# Patient Record
Sex: Female | Born: 1980 | Race: Black or African American | Hispanic: No | Marital: Married | Smoking: Never smoker
Health system: Southern US, Community
[De-identification: ages and names within clinical notes are randomized; demographics above are authoritative.]

## PROBLEM LIST (undated history)

## (undated) DIAGNOSIS — O24419 Gestational diabetes mellitus in pregnancy, unspecified control: Secondary | ICD-10-CM

## (undated) DIAGNOSIS — N390 Urinary tract infection, site not specified: Secondary | ICD-10-CM

## (undated) DIAGNOSIS — O09299 Supervision of pregnancy with other poor reproductive or obstetric history, unspecified trimester: Secondary | ICD-10-CM

## (undated) HISTORY — PX: WISDOM TOOTH EXTRACTION: SHX21

## (undated) HISTORY — PX: NO PAST SURGERIES: SHX2092

---

## 2009-02-11 ENCOUNTER — Ambulatory Visit (HOSPITAL_COMMUNITY): Admission: RE | Admit: 2009-02-11 | Discharge: 2009-02-11 | Payer: Self-pay | Admitting: Obstetrics and Gynecology

## 2009-03-24 ENCOUNTER — Inpatient Hospital Stay (HOSPITAL_COMMUNITY): Admission: AD | Admit: 2009-03-24 | Discharge: 2009-03-24 | Payer: Self-pay | Admitting: Obstetrics and Gynecology

## 2009-04-21 ENCOUNTER — Inpatient Hospital Stay (HOSPITAL_COMMUNITY): Admission: AD | Admit: 2009-04-21 | Discharge: 2009-04-23 | Payer: Self-pay | Admitting: Obstetrics and Gynecology

## 2009-04-22 ENCOUNTER — Encounter (INDEPENDENT_AMBULATORY_CARE_PROVIDER_SITE_OTHER): Payer: Self-pay | Admitting: Obstetrics and Gynecology

## 2009-04-26 DEATH — deceased

## 2009-12-08 ENCOUNTER — Ambulatory Visit (HOSPITAL_COMMUNITY): Admission: RE | Admit: 2009-12-08 | Discharge: 2009-12-08 | Payer: Self-pay | Admitting: Obstetrics and Gynecology

## 2010-03-12 ENCOUNTER — Inpatient Hospital Stay (HOSPITAL_COMMUNITY)
Admission: AD | Admit: 2010-03-12 | Discharge: 2010-03-13 | Payer: Self-pay | Source: Home / Self Care | Attending: Obstetrics and Gynecology | Admitting: Obstetrics and Gynecology

## 2010-03-15 LAB — RH IMMUNE GLOBULIN WORKUP (NOT WOMEN'S HOSP)
ABO/RH(D): A NEG
Antibody Screen: NEGATIVE
Unit division: 0

## 2010-05-11 LAB — RH IMMUNE GLOBULIN WORKUP (NOT WOMEN'S HOSP)
ABO/RH(D): A NEG
Antibody Screen: NEGATIVE
Unit division: 0

## 2010-05-15 LAB — COMPREHENSIVE METABOLIC PANEL
ALT: 18 U/L (ref 0–35)
AST: 19 U/L (ref 0–37)
Albumin: 2.7 g/dL — ABNORMAL LOW (ref 3.5–5.2)
Alkaline Phosphatase: 63 U/L (ref 39–117)
BUN: 5 mg/dL — ABNORMAL LOW (ref 6–23)
CO2: 20 mEq/L (ref 19–32)
Calcium: 8.7 mg/dL (ref 8.4–10.5)
Chloride: 106 mEq/L (ref 96–112)
Creatinine, Ser: 0.47 mg/dL (ref 0.4–1.2)
GFR calc Af Amer: >60 mL/min (ref >60)
GFR calc non Af Amer: >60 mL/min (ref >60)
Glucose, Bld: 84 mg/dL (ref 70–99)
Potassium: 3.5 mEq/L (ref 3.5–5.1)
Sodium: 133 mEq/L — ABNORMAL LOW (ref 135–145)
Total Bilirubin: 0.5 mg/dL (ref 0.3–1.2)
Total Protein: 6.3 g/dL (ref 6.0–8.3)

## 2010-05-15 LAB — CBC
HCT: 36.2 % (ref 36.0–46.0)
Hemoglobin: 12.2 g/dL (ref 12.0–15.0)
MCHC: 33.8 g/dL (ref 30.0–36.0)
MCV: 94 fL (ref 78.0–100.0)
Platelets: 310 10*3/uL (ref 150–400)
RBC: 3.85 MIL/uL — ABNORMAL LOW (ref 3.87–5.11)
RDW: 13.8 % (ref 11.5–15.5)
WBC: 9 10*3/uL (ref 4.0–10.5)

## 2010-05-15 LAB — LACTATE DEHYDROGENASE: LDH: 109 U/L (ref 94–250)

## 2010-05-15 LAB — URIC ACID: Uric Acid, Serum: 4.1 mg/dL (ref 2.4–7.0)

## 2010-05-18 LAB — ANA: Anti Nuclear Antibody(ANA): POSITIVE — AB

## 2010-05-18 LAB — RPR: RPR Ser Ql: NONREACTIVE

## 2010-05-18 LAB — CBC
HCT: 40.3 % (ref 36.0–46.0)
HCT: 41.8 % (ref 36.0–46.0)
Hemoglobin: 13.4 g/dL (ref 12.0–15.0)
Hemoglobin: 13.7 g/dL (ref 12.0–15.0)
MCHC: 32.8 g/dL (ref 30.0–36.0)
MCHC: 33.1 g/dL (ref 30.0–36.0)
MCV: 95.3 fL (ref 78.0–100.0)
MCV: 95.9 fL (ref 78.0–100.0)
Platelets: 293 10*3/uL (ref 150–400)
Platelets: 336 10*3/uL (ref 150–400)
RBC: 4.23 MIL/uL (ref 3.87–5.11)
RBC: 4.36 MIL/uL (ref 3.87–5.11)
RDW: 13.9 % (ref 11.5–15.5)
RDW: 14 % (ref 11.5–15.5)
WBC: 13.1 10*3/uL — ABNORMAL HIGH (ref 4.0–10.5)
WBC: 18.5 10*3/uL — ABNORMAL HIGH (ref 4.0–10.5)

## 2010-05-18 LAB — LUPUS ANTICOAGULANT PANEL
Lupus Anticoagulant: DETECTED — AB
PTT Lupus Anticoagulant: 46.3 secs — ABNORMAL HIGH (ref 32.0–43.4)

## 2010-05-18 LAB — PROTEIN S, TOTAL: Protein S Ag, Total: 64 % — ABNORMAL LOW (ref 70–140)

## 2010-05-18 LAB — CARDIOLIPIN ANTIBODIES, IGG, IGM, IGA
Anticardiolipin IgA: <3 APL U/mL — ABNORMAL LOW (ref <10)
Anticardiolipin IgG: 5 GPL U/mL — ABNORMAL LOW (ref <10)
Anticardiolipin IgM: <3 MPL U/mL — ABNORMAL LOW (ref <10)

## 2010-05-18 LAB — DIC (DISSEMINATED INTRAVASCULAR COAGULATION)PANEL
D-Dimer, Quant: 2.02 ug/mL-FEU — ABNORMAL HIGH (ref 0.00–0.48)
Fibrinogen: 573 mg/dL — ABNORMAL HIGH (ref 204–475)
INR: 0.97 (ref 0.00–1.49)
Platelets: 336 10*3/uL (ref 150–400)
Prothrombin Time: 12.8 seconds (ref 11.6–15.2)
Smear Review: NONE SEEN
aPTT: 27 seconds (ref 24–37)

## 2010-05-18 LAB — COMPREHENSIVE METABOLIC PANEL
ALT: 13 U/L (ref 0–35)
AST: 18 U/L (ref 0–37)
Albumin: 2.6 g/dL — ABNORMAL LOW (ref 3.5–5.2)
Alkaline Phosphatase: 88 U/L (ref 39–117)
BUN: 4 mg/dL — ABNORMAL LOW (ref 6–23)
CO2: 20 mEq/L (ref 19–32)
Calcium: 8.7 mg/dL (ref 8.4–10.5)
Chloride: 109 mEq/L (ref 96–112)
Creatinine, Ser: 0.46 mg/dL (ref 0.4–1.2)
GFR calc Af Amer: >60 mL/min (ref >60)
GFR calc non Af Amer: >60 mL/min (ref >60)
Glucose, Bld: 94 mg/dL (ref 70–99)
Potassium: 4.1 mEq/L (ref 3.5–5.1)
Sodium: 135 mEq/L (ref 135–145)
Total Bilirubin: 0.4 mg/dL (ref 0.3–1.2)
Total Protein: 6 g/dL (ref 6.0–8.3)

## 2010-05-18 LAB — ANTITHROMBIN III: AntiThromb III Func: 68 % — ABNORMAL LOW (ref 75–120)

## 2010-05-18 LAB — PROTIME-INR: Prothrombin Time: 13 seconds (ref 11.6–15.2)

## 2010-05-18 LAB — RAPID URINE DRUG SCREEN, HOSP PERFORMED
Amphetamines: NOT DETECTED
Barbiturates: NOT DETECTED
Benzodiazepines: NOT DETECTED
Cocaine: NOT DETECTED
Opiates: NOT DETECTED

## 2010-05-18 LAB — WOUND CULTURE
Culture: NO GROWTH
Gram Stain: NONE SEEN
Gram Stain: NONE SEEN

## 2010-05-18 LAB — RH IMMUNE GLOB WKUP(>/=20WKS)(NOT WOMEN'S HOSP): Fetal Screen: NEGATIVE

## 2010-05-18 LAB — BETA-2-GLYCOPROTEIN I ABS, IGG/M/A
Beta-2 Glyco I IgG: <3 U/mL (ref <=15)
Beta-2-Glycoprotein I IgA: <3 U/mL (ref <=15)
Beta-2-Glycoprotein I IgM: <3 U/mL (ref <=15)

## 2010-05-18 LAB — D-DIMER, QUANTITATIVE: D-Dimer, Quant: 0.88 ug/mL-FEU — ABNORMAL HIGH (ref 0.00–0.48)

## 2010-05-18 LAB — FACTOR 5 LEIDEN

## 2010-05-18 LAB — PROTEIN C, TOTAL: Protein C, Total: 94 % (ref 70–140)

## 2010-05-18 LAB — FIBRINOGEN: Fibrinogen: 586 mg/dL — ABNORMAL HIGH (ref 204–475)

## 2010-05-18 LAB — ANTI-NUCLEAR AB-TITER (ANA TITER): ANA Titer 1: NEGATIVE

## 2010-05-29 LAB — RH IMMUNE GLOBULIN WORKUP (NOT WOMEN'S HOSP)

## 2011-01-24 IMAGING — US US OB LIMITED
1 series · 7 of 7 positions shown · non-contrast
Comparison: none

OBSTETRICAL ULTRASOUND:
 This ultrasound exam was performed in the [HOSPITAL] Ultrasound Department.  The OB US report was generated in the AS system, and faxed to the ordering physician.  This report is also available in [HOSPITAL]?s Access[employer] and in [REDACTED] PACS.

[Series 1: us ob limited · 0.24mm/px · 7 acquisitions, 7 frames shown]
[im 1/7]
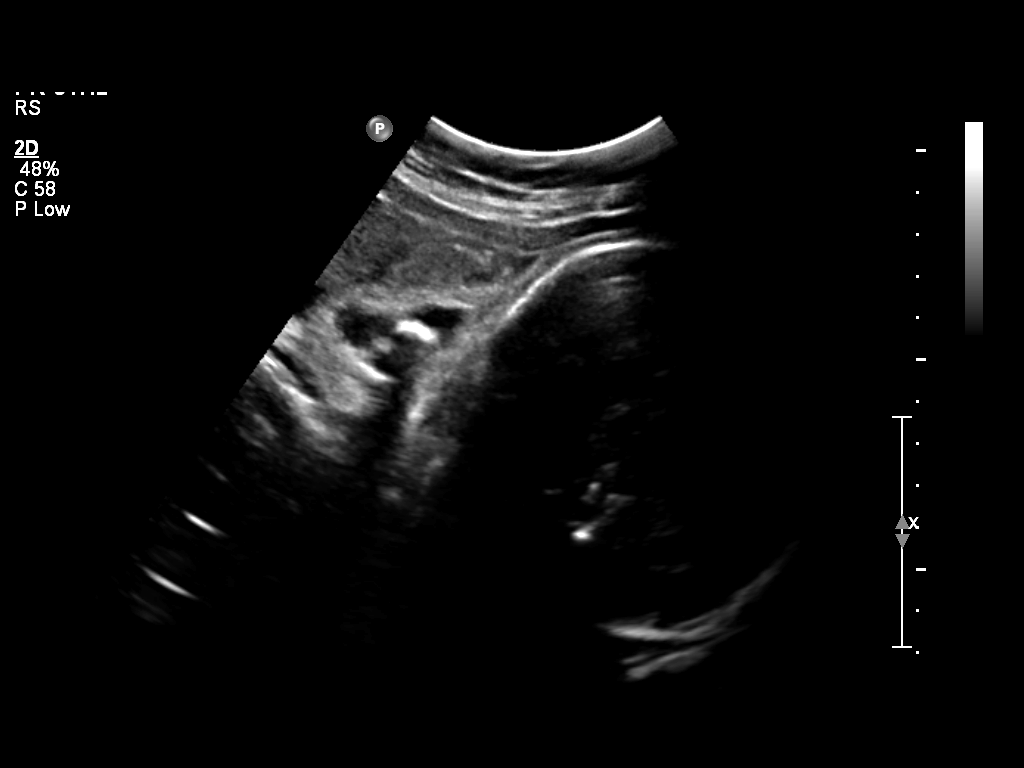
[im 2/7]
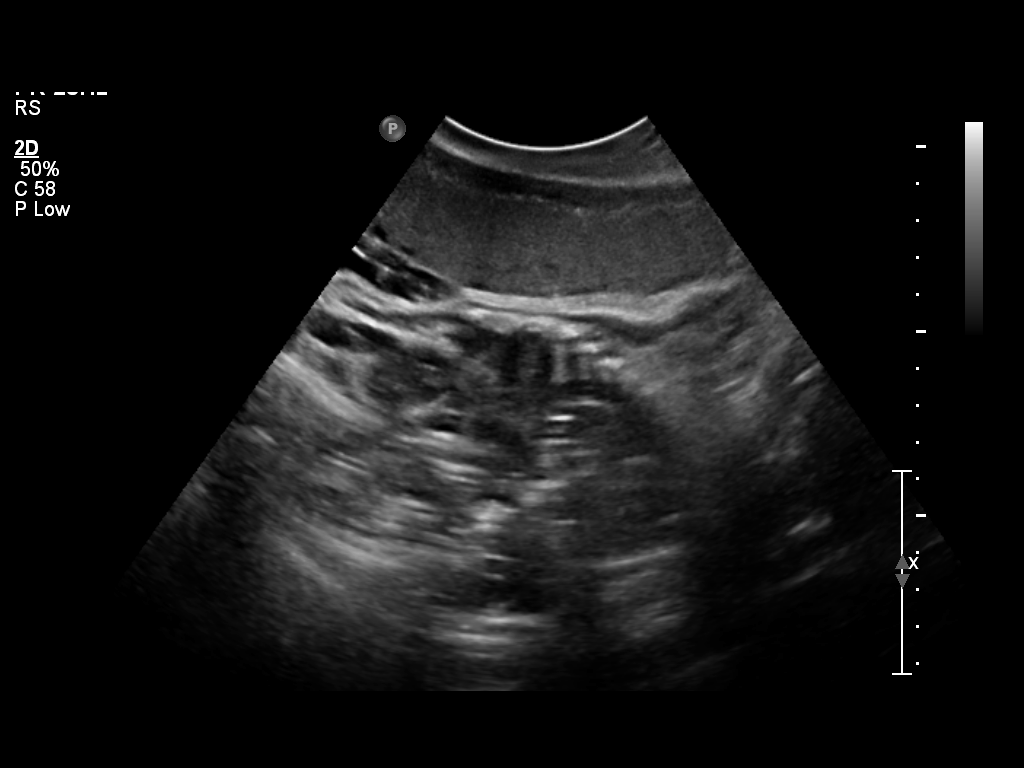
[im 3/7]
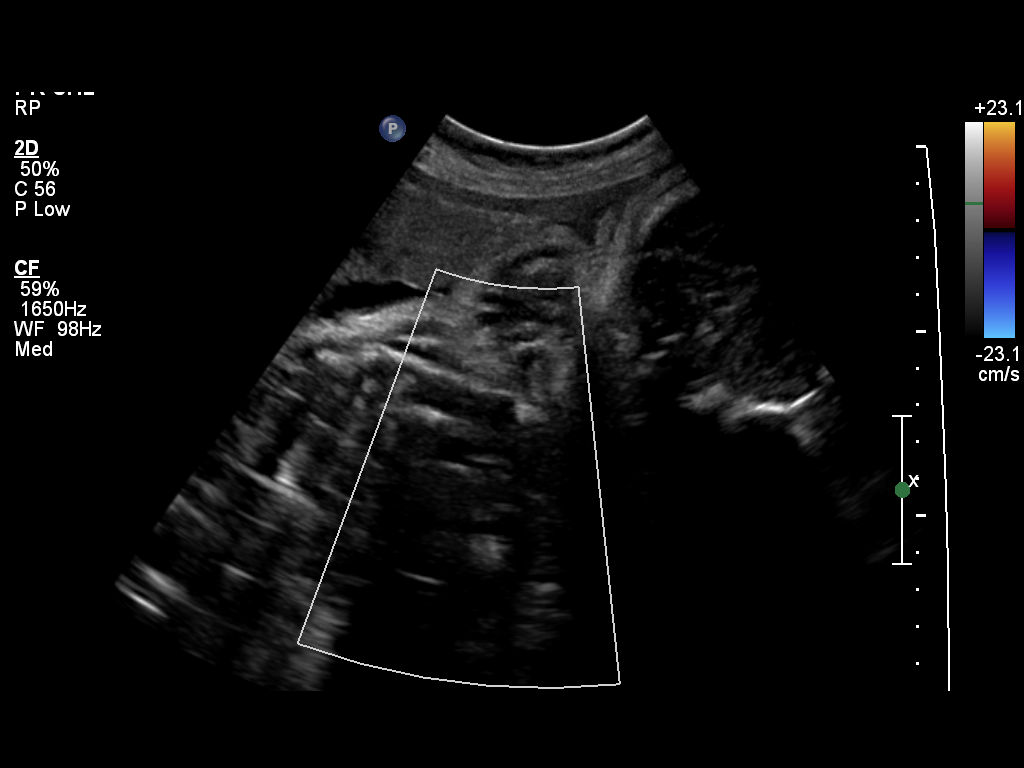
[im 4/7]
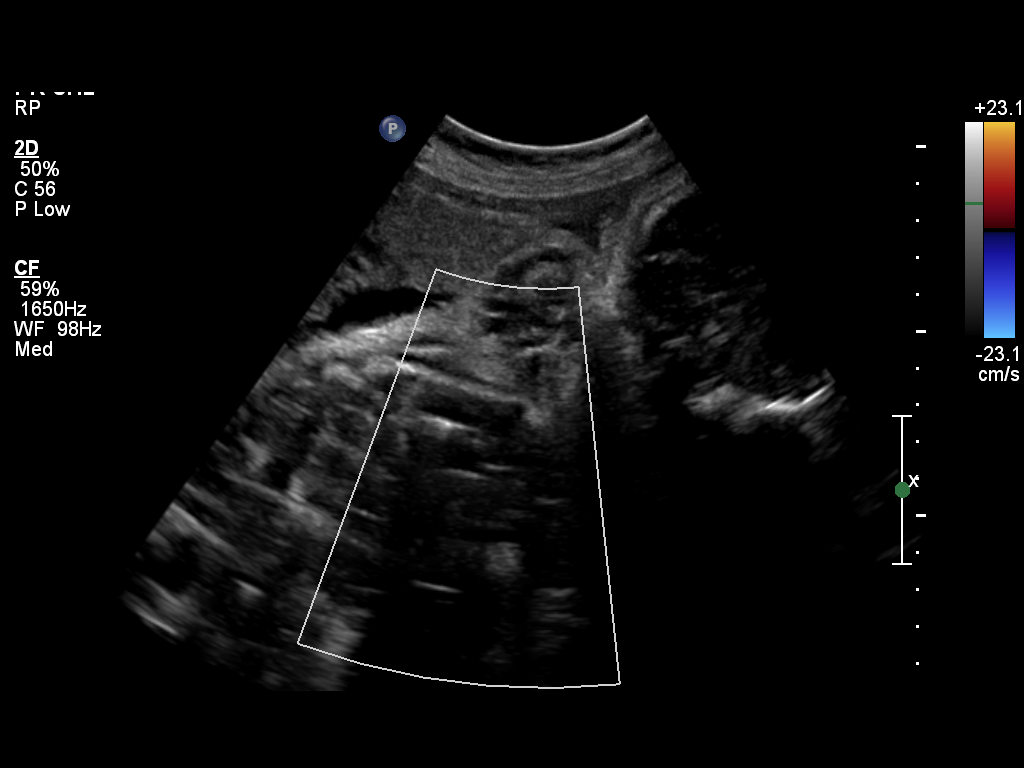
[im 5/7]
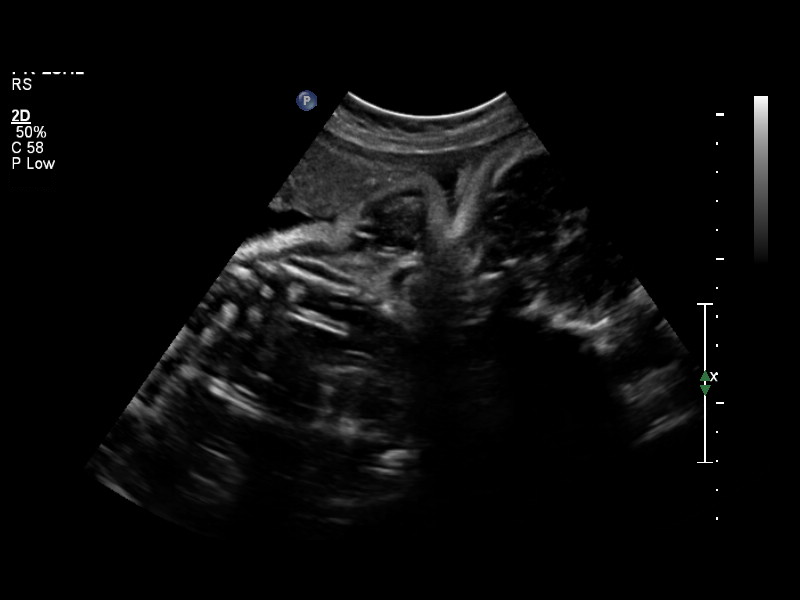
[im 6/7]
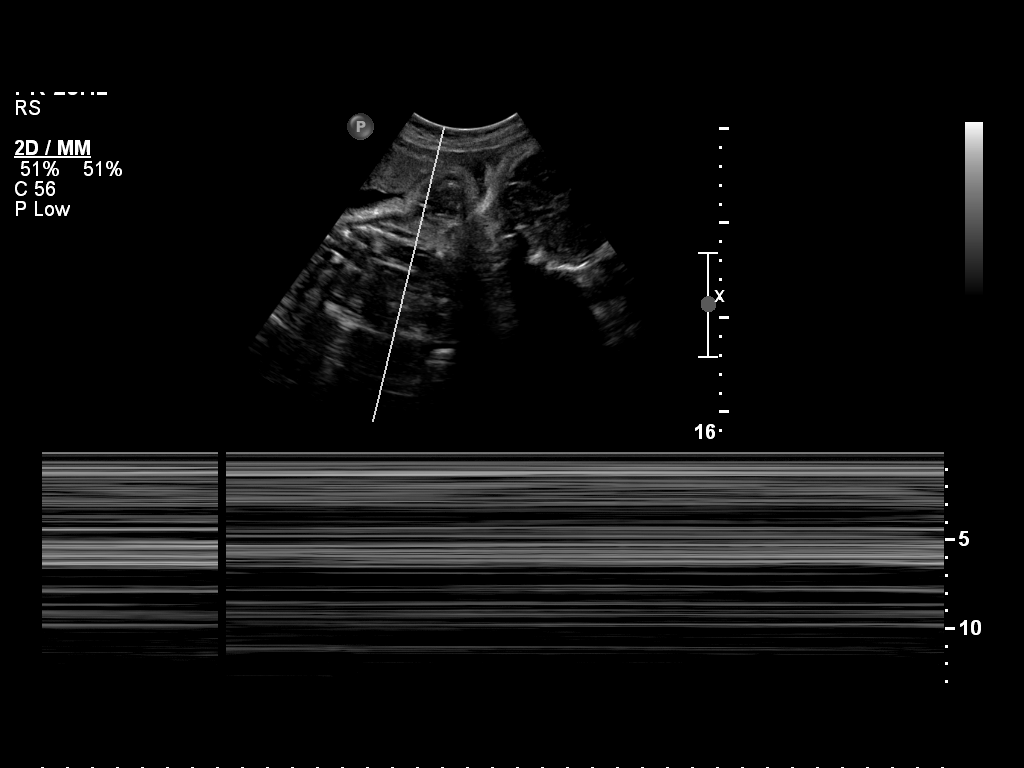
[im 7/7]
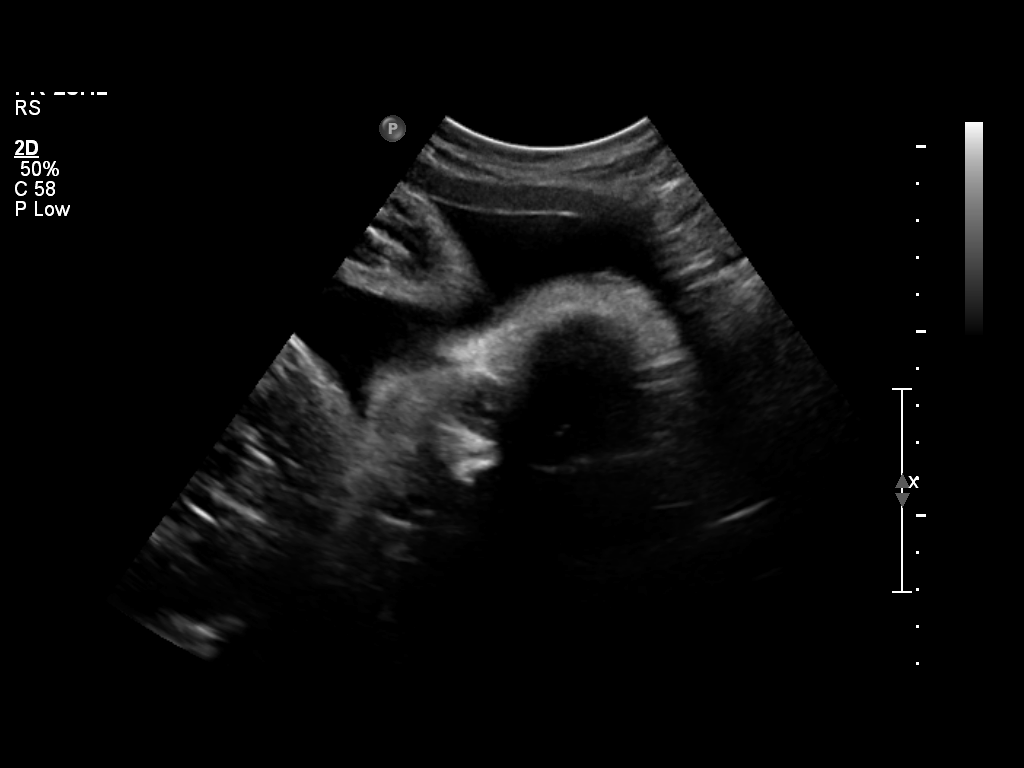

[7 of 7 positions shown; findings below may reference images not displayed]

IMPRESSION: See AS Obstetric US report.

## 2011-02-27 NOTE — L&D Delivery Note (Signed)
Delivery Note  Complete dilation at 0036 Onset of pushing at 0025 / cervix reduced to complete in order to facilitate delivery due to repetitive variable decels FHR second stage variable decels to nadir 80s x 30-50 sec  Analgesia - NONE / Anesthesia - NONE  Delivery of a viable female at 35 by CNM in LOA position.  Nuchal Cord x 1 / additional wrap x 1 body and x1 leg / fetal hand holding cord Cord double clamped after cessation of pulsation, cut by FOB.  Cord blood sample collected.  Very thin cord noted -placenta to pathology  Placenta delivered 0046 intact with 3 VC.  Uterine tone firm / bleeding small  No perineal laceration identified.  Small periurethral LAC - 1% xylocaine local to site for repair single 4-0 placed for hemostasis Est. Blood Loss (mL): 300 ml  Complications: none  Mom to postpartum.  Baby to Mom for breastfeeding.  Brandy Carroll 05/09/2011, 5:04 AM ( late entry - computer down)

## 2011-05-08 ENCOUNTER — Encounter (HOSPITAL_COMMUNITY): Payer: Self-pay | Admitting: *Deleted

## 2011-05-08 ENCOUNTER — Inpatient Hospital Stay (HOSPITAL_COMMUNITY)
Admission: AD | Admit: 2011-05-08 | Discharge: 2011-05-11 | DRG: 372 | Disposition: A | Discharge status: Home / Self Care | Payer: BC Managed Care – PPO | Source: Ambulatory Visit | Attending: Obstetrics & Gynecology | Admitting: Obstetrics & Gynecology

## 2011-05-08 DIAGNOSIS — O09299 Supervision of pregnancy with other poor reproductive or obstetric history, unspecified trimester: Secondary | ICD-10-CM

## 2011-05-08 DIAGNOSIS — O368190 Decreased fetal movements, unspecified trimester, not applicable or unspecified: Secondary | ICD-10-CM | POA: Diagnosis present

## 2011-05-08 DIAGNOSIS — O99814 Abnormal glucose complicating childbirth: Secondary | ICD-10-CM | POA: Diagnosis present

## 2011-05-08 DIAGNOSIS — O24419 Gestational diabetes mellitus in pregnancy, unspecified control: Secondary | ICD-10-CM

## 2011-05-08 DIAGNOSIS — O4100X0 Oligohydramnios, unspecified trimester, not applicable or unspecified: Principal | ICD-10-CM | POA: Diagnosis present

## 2011-05-08 HISTORY — DX: Supervision of pregnancy with other poor reproductive or obstetric history, unspecified trimester: O09.299

## 2011-05-08 HISTORY — DX: Gestational diabetes mellitus in pregnancy, unspecified control: O24.419

## 2011-05-08 LAB — CBC
HCT: 37.8 % (ref 36.0–46.0)
Hemoglobin: 12.2 g/dL (ref 12.0–15.0)
MCH: 30 pg (ref 26.0–34.0)
MCHC: 32.3 g/dL (ref 30.0–36.0)
MCV: 93.1 fL (ref 78.0–100.0)
Platelets: 286 10*3/uL (ref 150–400)
RBC: 4.06 MIL/uL (ref 3.87–5.11)
RDW: 14.6 % (ref 11.5–15.5)
WBC: 8.7 10*3/uL (ref 4.0–10.5)

## 2011-05-08 LAB — RPR: RPR Ser Ql: NONREACTIVE

## 2011-05-08 MED ORDER — CITRIC ACID-SODIUM CITRATE 334-500 MG/5ML PO SOLN
30.0000 mL | ORAL | Status: DC | PRN
  Filled 2011-05-08: qty 15

## 2011-05-08 MED ORDER — LACTATED RINGERS IV SOLN
500.0000 mL | INTRAVENOUS | Status: DC | PRN
  Administered 2011-05-09: 500 mL via INTRAVENOUS

## 2011-05-08 MED ORDER — LACTATED RINGERS IV SOLN
INTRAVENOUS | Status: DC
  Administered 2011-05-08: 14:00:00 via INTRAVENOUS

## 2011-05-08 MED ORDER — LIDOCAINE HCL (PF) 1 % IJ SOLN
30.0000 mL | INTRAMUSCULAR | Status: DC | PRN
  Administered 2011-05-09: 30 mL via SUBCUTANEOUS
  Filled 2011-05-08: qty 30

## 2011-05-08 MED ORDER — IBUPROFEN 600 MG PO TABS: 600.0000 mg | ORAL_TABLET | Freq: Four times a day (QID) | ORAL | Status: DC | PRN

## 2011-05-08 MED ORDER — ACETAMINOPHEN 325 MG PO TABS: 650.0000 mg | ORAL_TABLET | ORAL | Status: DC | PRN

## 2011-05-08 MED ORDER — OXYTOCIN 20 UNITS IN LACTATED RINGERS INFUSION - SIMPLE
1.0000 m[IU]/min | INTRAVENOUS | Status: DC
  Administered 2011-05-08: 2 m[IU]/min via INTRAVENOUS
  Administered 2011-05-08: 12 m[IU]/min via INTRAVENOUS
  Administered 2011-05-08: 14 m[IU]/min via INTRAVENOUS
  Filled 2011-05-08: qty 1000

## 2011-05-08 NOTE — H&P (Signed)
OB ADMISSION/ HISTORY & PHYSICAL:  Admission Date: 05/08/2011  1:52 PM  Admit Diagnosis: Term with decreased fetal movements / HX IUFD at 37 weeks / GDM-A2  Brandy Carroll is a 31 y.o. female presenting for admission and induction of labor after reported decrease in fetal movements x 2 days. NST at office reactive / BPP 6-8 with decreased AFI to 8.2 today. GDM-A2 - well controlled on glyberide 1.25mg  / AGA fetus per serial ultrasound. EFW at 34 weeks at 75% symmetric.  Prenatal History: [id number]   EDC : 05/14/2011, by Last Menstrual Period / Sono confirmed w/in 6 day discrepancy (05/20/2011) P[facility]atal care at [facility] since11 weeks gestation  Prenatal course complicated by previous IUFD at 37 weeks / glucose intolerance / GDM-A2  Prenatal Labs: ABO, Rh:   A negative Antibody:  negative Rubella:   Immune RPR:   NR HBsAg:   Negative HIV:   NR GBS:   Negative 1 hr Glucola : abnormal   Medical / Surgical History :  Past medical history:  Past Medical History  Diagnosis Date  . Gestational diabetes   . Gestational diabetes mellitus, class A2 05/08/2011     Past surgical history:  Past Surgical History  Procedure Date  . No past surgeries      Family History:  Family History  Problem Relation Age of Onset  . Arthritis Mother   . Hypertension Father   . Hyperlipidemia Father   . Arthritis Maternal Aunt   . Mental illness Maternal Aunt   . Early death Maternal Aunt   . Mental illness Maternal Uncle   . Cancer Paternal Aunt   . Diabetes Paternal Aunt   . Arthritis Maternal Grandmother   . Alcohol abuse Maternal Grandfather   . Heart disease Maternal Grandfather      Social History:  reports that she has never smoked. She does not have any smokeless tobacco history on file. She reports that she does not drink alcohol or use illicit drugs.   Allergies: Benzoyl peroxide    Current Medications at time of admission:  Prenatal vitamin  daily glyburide 1.25mg  at HS  Review of Systems: Decreased FM x 2 days - only fetal movement with maternal stimulation (moving abdomen) No LOF No bleeding Itching x 1 week (bile acids and LE normal)  Physical Exam:  Dilation: 3.5 Effacement (%): 80 Station: -2 Exam by:: Marlinda Mike CNM    General:NAD Heart: RR Lungs: unlabored Abdomen: gravid / non-tender Extremities: trace edema / non-pitting  FHR: 145 / moderate variability / no decels TOCO: every 4-6 minutes   Assessment: Decreased FM - hx IUFD Favorable cervix  No evidence of cholestasis  Plan:  Admit for delivery - s/p consultation with Dr Sherrie George (MFM) and Dr Seymour Bars (on-call MD) Pitocin low dose protocol Desires hydrotherapy intrapartum - not candidate for water birth        Hydrotherapy in active labor dependent upon fetal status intrapartum  Marlinda Mike 05/08/2011, 7:08 PM

## 2011-05-08 NOTE — Progress Notes (Signed)
Pt into pool for hydrotheraphy.  Water Temp 98.6

## 2011-05-08 NOTE — Progress Notes (Signed)
S: Feeling ok - ctx tight but not painful     Tolerating contractions well     Planning laboring in water - aware not candidate for water birth & will continuously EFM with wireless    O:  VS: Blood pressure 135/76, pulse 100, temperature 98 F (36.7 C), temperature source Oral, resp. rate 18, height 5\' 6"  (1.676 m), weight 203 lb (92.08 kg), last menstrual period 08/07/2010.        FHR : baseline 145 / variability moderate / accels 10x10 / decels none        Toco: contractions every 3-4 minutes / pitocin 42mu/min        Cervix : 4/80/vtx / -2 / BBOW        Membranes: AROM - clear fluid  A: Latent labor with IOL pitocin / hx IUFD / GDM-A2      FHR category 1  P: Pitocin IOL     Hydrotherapy in active labor - continuous EFM with wireless     Out of tub for second stage / delivery or sooner if any FHR concerns     Marlinda Mike 05/08/2011, 5:43 PM

## 2011-05-08 NOTE — Progress Notes (Signed)
S: Feeling ok - ctx getting stronger     Tolerating contractions well      Desires to enter tub for awhile   O:  VS: Blood pressure 127/75, pulse 81, temperature 98 F (36.7 C), temperature source Oral, resp. rate 20, height 5\' 6"  (1.676 m), weight 203 lb (92.08 kg), last menstrual period 08/07/2010.        FHR : baseline 135 / variability moderate / accels + to 150 / occasional variable decel to nadir 90        Toco: contractions every 2-3 minutes / pitocin 40mu/min         Cervix : 5/80/-2 with fetal fingers present at 11o'clock location        Membranes: clear fluid leakage  A: GDM-A2 / hx IUFD at term     active labor     FHR category 1  P: trial of water hydrotherapy     continuous EFM with wireless      continue pitocin     CBG every 4 hours     Marlinda Mike 05/08/2011, 7:55 PM

## 2011-05-08 NOTE — Progress Notes (Signed)
S: Feeling pressure with ctx / no urge to push     Tolerating contractions well     Laboring in tub - intermittently out of tub in past 3 hours   O:  VS: Blood pressure 135/65, pulse 85, temperature 98.7 F (37.1 C), temperature source Oral, resp. rate 18, height 5\' 6"  (1.676 m), weight 92.08 kg (203 lb), last menstrual period 08/07/2010.        Water temp checked hourly (97-99 degrees)        FHR : baseline 135 / variability moderate / variable decels to nadir 90-112        Toco: contractions every 2-2 minutes/ 60 sec         Pitocin 18 mu/min        Cervix : 6-7 / 90% / Vtx 0 down to +1 with ctx / asynclintic / no compound presentation this exam        Membranes: clear fluid with show  A: Active labor     FHR category 2  P: Out of tub - variable decels persistent      Reposition for fetal rotation and descent      Monitoring closely      Recehck for progress in next 1-2 hour or sooner if urge to push           Kiara Keep 05/08/2011, 11:00 PM

## 2011-05-08 NOTE — Progress Notes (Signed)
Pt up to bathroom.  Water temp 98.6

## 2011-05-08 NOTE — Progress Notes (Signed)
Pt back into water.  Water temp 98.6

## 2011-05-09 ENCOUNTER — Encounter (HOSPITAL_COMMUNITY): Payer: Self-pay

## 2011-05-09 LAB — GLUCOSE, CAPILLARY: Glucose-Capillary: 94 mg/dL (ref 70–99)

## 2011-05-09 MED ORDER — EPHEDRINE 5 MG/ML INJ: 10.0000 mg | INTRAVENOUS | Status: DC | PRN

## 2011-05-09 MED ORDER — TETANUS-DIPHTH-ACELL PERTUSSIS 5-2.5-18.5 LF-MCG/0.5 IM SUSP
0.5000 mL | Freq: Once | INTRAMUSCULAR | Status: AC
  Administered 2011-05-09: 0.5 mL via INTRAMUSCULAR
  Filled 2011-05-09: qty 0.5

## 2011-05-09 MED ORDER — PHENYLEPHRINE 40 MCG/ML (10ML) SYRINGE FOR IV PUSH (FOR BLOOD PRESSURE SUPPORT): 80.0000 ug | PREFILLED_SYRINGE | INTRAVENOUS | Status: DC | PRN

## 2011-05-09 MED ORDER — LACTATED RINGERS IV SOLN: 500.0000 mL | Freq: Once | INTRAVENOUS | Status: DC

## 2011-05-09 MED ORDER — SENNOSIDES-DOCUSATE SODIUM 8.6-50 MG PO TABS: 2.0000 | ORAL_TABLET | Freq: Every day | ORAL | Status: DC

## 2011-05-09 MED ORDER — OXYCODONE-ACETAMINOPHEN 5-325 MG PO TABS: 1.0000 | ORAL_TABLET | ORAL | Status: DC | PRN

## 2011-05-09 MED ORDER — BENZOCAINE-MENTHOL 20-0.5 % EX AERO
1.0000 "application " | INHALATION_SPRAY | CUTANEOUS | Status: DC | PRN
  Administered 2011-05-10: 1 via TOPICAL

## 2011-05-09 MED ORDER — SIMETHICONE 80 MG PO CHEW: 80.0000 mg | CHEWABLE_TABLET | ORAL | Status: DC | PRN

## 2011-05-09 MED ORDER — IBUPROFEN 600 MG PO TABS
600.0000 mg | ORAL_TABLET | Freq: Four times a day (QID) | ORAL | Status: DC
  Administered 2011-05-10 – 2011-05-11 (×5): 600 mg via ORAL
  Filled 2011-05-09 (×7): qty 1

## 2011-05-09 MED ORDER — DIPHENHYDRAMINE HCL 50 MG/ML IJ SOLN: 12.5000 mg | INTRAMUSCULAR | Status: DC | PRN

## 2011-05-09 MED ORDER — LANOLIN HYDROUS EX OINT: TOPICAL_OINTMENT | CUTANEOUS | Status: DC | PRN

## 2011-05-09 MED ORDER — WITCH HAZEL-GLYCERIN EX PADS: 1.0000 "application " | MEDICATED_PAD | CUTANEOUS | Status: DC | PRN

## 2011-05-09 MED ORDER — DIBUCAINE 1 % RE OINT: 1.0000 "application " | TOPICAL_OINTMENT | RECTAL | Status: DC | PRN

## 2011-05-09 MED ORDER — DIPHENHYDRAMINE HCL 25 MG PO CAPS: 25.0000 mg | ORAL_CAPSULE | Freq: Four times a day (QID) | ORAL | Status: DC | PRN

## 2011-05-09 MED ORDER — FENTANYL 2.5 MCG/ML BUPIVACAINE 1/10 % EPIDURAL INFUSION (WH - ANES): 14.0000 mL/h | INTRAMUSCULAR | Status: DC

## 2011-05-09 NOTE — Progress Notes (Signed)
S: Feeling strong urge to push in past 20 minutes     Tolerating contractions well - no analgesia  O:  VS: Blood pressure 118/74, pulse 100, temperature 99.1 F (37.3 C), temperature source Oral, resp. rate 18, height 5\' 6"  (1.676 m), weight 92.08 kg (203 lb), last menstrual period 08/07/2010, SpO2 95.00%, unknown if currently breastfeeding.                FHR : baseline 135 / variability moderate / recurrent decels -variables down to nadir 80s with ctx        Toco: contractions every 2 minutes / pitocin OFF         Cervix : 9cm / 95% / vtx +1        Membranes: clear fluid  A: active labor     FHR category 2-3  P: continuous labor support     Dr Seymour Bars called to stand-by / possible cesarean section / anesthesia notified      Attempting to reduce remaining cervix to attempt SVB     Brandy Carroll 05/09/2011, 0030 (late entry at 0430)

## 2011-05-09 NOTE — Progress Notes (Signed)
PPD 0 SVD  S:  Reports feeling well             Tolerating po/ No nausea or vomiting             Bleeding is moderate             Pain controlled with motrin             Up ad lib / ambulatory  Newborn breast feeding  / Circumcision planned   O:  A & O x 3              VS: Blood pressure 97/66, pulse 80, temperature 98.5 F (36.9 C), temperature source Oral, resp. rate 18, height 5\' 6"  (1.676 m), weight 92.08 kg (203 lb), last menstrual period 08/07/2010, SpO2 97.00%, unknown if currently breastfeeding.  LABS: pending  Lungs: Clear and unlabored  Heart: regular rate and rhythm / no mumurs  Abdomen: soft, non-tender, non-distended              Fundus: firm, non-tender, U-1  Perineum: mild edema - ice pack in place  Lochia: moderate  Extremities: trace edema, no calf pain or tenderness    A: PPD # 0   Doing well - stable status  P:  Routine post partum orders   Marlinda Mike 05/09/2011, 10:17 AM

## 2011-05-10 ENCOUNTER — Encounter (HOSPITAL_COMMUNITY): Payer: Self-pay | Admitting: Obstetrics and Gynecology

## 2011-05-10 MED ORDER — BENZOCAINE-MENTHOL 20-0.5 % EX AERO
INHALATION_SPRAY | CUTANEOUS | Status: AC
  Administered 2011-05-10: 1 via TOPICAL
  Filled 2011-05-10: qty 56

## 2011-05-10 MED ORDER — RHO D IMMUNE GLOBULIN 1500 UNIT/2ML IJ SOLN
300.0000 ug | Freq: Once | INTRAMUSCULAR | Status: AC
  Administered 2011-05-10: 300 ug via INTRAMUSCULAR
  Filled 2011-05-10: qty 2

## 2011-05-10 NOTE — Progress Notes (Signed)
PPD 1 SVD  S:  Reports feeling well, considering early D/C.             Tolerating po/ No nausea or vomiting             Bleeding is light             Pain controlled withnone             Up ad lib / ambulatory  Newborn  Information for the patient's newborn:  Kobe, Jansma [[id number]]  female  breast feeding  / Circumcision planned   O:  A & O x 3 NAD             VS: Blood pressure 129/85, pulse 74, temperature 98.3 F (36.8 C), temperature source Oral, resp. rate 18, height 5\' 6"  (1.676 m), weight 92.08 kg (203 lb), last menstrual period 08/07/2010, SpO2 97.00%, unknown if currently breastfeeding.  LABS:  Basename 05/08/11 1422  WBC 8.7  HGB 12.2  HCT 37.8  PLT 286    I&O: I/O last 3 completed shifts: In: 353 [I.V.:353] Out: 300 [Blood:300]      Lungs: Clear and unlabored  Heart: regular rate and rhythm / no mumurs  Abdomen: soft, non-tender, non-distended, muscle laxity, wears binder             Fundus: firm, non-tender, U-2  Perineum: intact  Lochia: scant  Extremities: no edema, no calf pain or tenderness, neg Homans    A/P: PPD # 1 30 y.o., [id number] SVD 3/13     Doing well - stable status  Routine post partum orders  Will d/c home later this PM if infant stable post circ.   Arlan Organ CNM, MSN 05/10/2011, 10:18 AM

## 2011-05-11 ENCOUNTER — Encounter (HOSPITAL_COMMUNITY): Payer: Self-pay | Admitting: *Deleted

## 2011-05-11 LAB — RH IG WORKUP (INCLUDES ABO/RH): Gestational Age(Wks): 39.2

## 2011-05-11 MED ORDER — IBUPROFEN 600 MG PO TABS: 600.0000 mg | ORAL_TABLET | Freq: Four times a day (QID) | ORAL | Status: AC | PRN

## 2011-05-11 NOTE — Progress Notes (Signed)
PPD 2 SVD  S:  Reports feeling well.              Tolerating po/ No nausea or vomiting             Bleeding is light             Pain controlled with ibuprofen             Up ad lib / ambulatory  Newborn  Information for the patient's newborn:  [name], [name] [[id number]]  female   breast feeding well  / Circumcision complete   O:  A & O x 3 NAD             VS: Blood pressure 122/80, pulse 83, temperature 98.4 F (36.9 C), temperature source Oral, resp. rate 18, height 5\' 6"  (1.676 m), weight 92.08 kg (203 lb), last menstrual period 08/07/2010, SpO2 97.00%, unknown if currently breastfeeding.  LABS:   Basename 05/08/11 1422  WBC 8.7  HGB 12.2  HCT 37.8  PLT 286    I&O:        Abdomen: soft, non-tender, non-distended, muscle laxity, wears binder             Fundus: firm, non-tender, U-2-3  Perineum: intact  Lochia: scant  Extremities: no edema, no calf pain or tenderness, neg Homans    A/P: PPD # 2 30 y.o., [id number] SVD 3/13     Doing well - stable status  Breastfeeding well; +lactogenesis II  Routine post partum orders  DC to home  F/U 6 weeks with Brandy Carroll, CNM  Lactation consultation PRN   Juanetta Beets, SNM Larri Brewton, CNM, MSN 05/11/2011, 10:20 AM

## 2011-05-11 NOTE — Discharge Instructions (Signed)
Breast Pumping Tips Pumping your breast milk is a good way to stimulate milk production and have a steady supply of breast milk for your infant. Pumping is most helpful during your infant's growth spurts, when involving dad or a family member, or when you are away. There are several types of pumps available. They can be purchased at a baby or maternity store. You can begin pumping soon after delivery, but some experts believe that you should wait about four weeks to give your infant a bottle. In general, the more you breastfeed or pump, the more milk you will have for your infant. It is also important to take good care of yourself. This will reduce stress and help your body to create a healthy supply of milk. Your caregiver or lactation consultant can give you the information and support you need in your efforts to breastfeed your infant. PUMPING BREAST MILK  Follow the tips below for successful breast pumping. Take care of yourself.  Drink enough water or fluids to keep urine clear or pale yellow. You may notice a thirsty feeling while breastfeeding. This is because your body needs more water to make breast milk. Keep a large water bottle handy. Make healthy drink choices such as unsweetened fruit juice, milk and water. Limit soda, coffee, and alcohol (wait 2 hours to feed or pump if you have an alcoholic drink.)   Eat a healthy, well-balanced diet rich in fruits, vegetables, and whole grains.   Exercise as recommended by your caregiver.   Get plenty of sleep. Sleep when your infant sleeps. Ask friends and family for help if you need time to nap or rest.   Do not smoke. Smoking can lower your milk supply and harm your infant. If you need help quitting, ask your caregiver for a program recommendation.   Ask your caregiver about birth control options. Birth control pills may lower your milk supply. You may be advised to use condoms or other forms of birth control.  Relax and pump Stimulating your  let-down reflex is the key to successful and effective pumping. This makes the milk in all parts of the breast flow more freely.   It is easier to pump breast milk (and breastfeed) while you are relaxed. Find techniques that work for you. Quiet private spaces, breast massage, soothing heat placed on the breast, music, and pictures or a tape recording of your infant may help you to relax and "let down" your milk. If you have difficulty with your let down, try smelling one of your infant's blankets or an item of clothing he or she has worn while you are pumping.   When pumping, place the special suction cup (flange) directly over the nipple. It may be uncomfortable and cause nipple damage if it is not placed properly or is the wrong size. Applying a small amount of purified or modified lanolin to your nipple and the areola may help increase your comfort level. Also, you can change the speed and suction of many electric pumps to your comfort level. Your caregiver or lactation consultant can help you with this.   If pumping continues to be painful, or you feel you are not getting very much milk when you pump, you may need a different type of pump. A lactation consultant can help you determine if this is the case.   If you are with your infant, feed him or her on demand and try pumping after each feeding. This will boost your production, even if milk   does not come out. You may not be able to pump much milk at first, but keep up the routine, and this will change.   If you are working or away from your infant for several hours, try pumping for about 15 minutes every 2 to 3 hours. Pump both breasts at the same time if you can.   If your infant has a formula feeding, make sure you pump your milk around the same time to maintain your supply.   Begin pumping breast milk a few weeks before you return to work. This will help you develop techniques that work for you and will be able to store extra milk.   Find a  source of breastfeeding information that works well for you.  TIPS FOR STORING BREAST MILK  Store breast milk in a sealable sterile bag, jar, or container provided with your pumping supplies.   Store milk in small amounts close to what your infant is drinking at each feeding.   Cool pumped milk in a refrigerator or cooler. Pumped milk can last at the back of the refrigerator for 3 to 8 days.   Place cooled milk at the back of the freezer for up to 3 months.   Thaw the milk in its container or bag in warm water up to 24 hours in advance. Do not use a microwave to thaw or heat milk. Do not refreeze the milk after it has been thawed.   Breast milk is safe to drink when left at room temperature (mid 70s or colder) for 4 to 8 hours. After that, throw it away.   Milk fat can separate and look funny. The color can vary slightly from day to day. This is normal. Always shake the milk before using it to mix the fat with the more watery portion.  SEEK MEDICAL CARE IF:   You are having trouble pumping or feeding your infant.   You are concerned that you are not making enough milk.   You have nipple pain, soreness, or redness.   You have other questions or concerns related to you or your infant.  Document Released: 08/02/2009 Document Revised: 02/01/2011 Document Reviewed: 08/02/2009 [employer] Patient Information 2012 [employer].Postpartum Depression and Baby Blues The postpartum period begins right after the birth of a baby. During this time, there is often a great amount of joy and excitement. It is also a time of considerable changes in the life of the parent(s). Regardless of how many times a mother gives birth, each child brings new challenges and dynamics to the family. It is not unusual to have feelings of excitement accompanied by confusing shifts in moods, emotions, and thoughts. All mothers are at risk of developing postpartum depression or the "baby blues." These mood changes can occur  right after giving birth, or they may occur many months after giving birth. The baby blues or postpartum depression can be mild or severe. Additionally, postpartum depression can resolve rather quickly, or it can be a long-term condition. CAUSES Elevated hormones and their rapid decline are thought to be a main cause of postpartum depression and the baby blues. There are a number of hormones that radically change during and after pregnancy. Estrogen and progesterone usually decrease immediately after delivering your baby. The level of thyroid hormone and various cortisol steroids also rapidly drop. Other factors that play a major role in these changes include major life events and genetics.  RISK FACTORS If you have any of the following risks for the   baby blues or postpartum depression, know what symptoms to watch out for during the postpartum period. Risk factors that may increase the likelihood of getting the baby blues or postpartum depression include:  Havinga personal or family history of depression.   Having depression while being pregnant.   Having premenstrual or oral contraceptive-associated mood issues.   Having exceptional life stress.   Having marital conflict.   Lacking a social support network.   Having a baby with special needs.   Having health problems such as diabetes.  SYMPTOMS Baby blues symptoms include:  Brief fluctuations in mood, such as going from extreme happiness to sadness.   Decreased concentration.   Difficulty sleeping.   Crying spells, tearfulness.   Irritability.   Anxiety.  Postpartum depression symptoms typically begin within the first month after giving birth. These symptoms include:  Difficulty sleeping or excessive sleepiness.   Marked weight loss.   Agitation.   Feelings of worthlessness.   Lack of interest in activity or food.  Postpartum psychosis is a very concerning condition and can be dangerous. Fortunately, it is rare.  Displaying any of the following symptoms is cause for immediate medical attention. Postpartum psychosis symptoms include:  Hallucinations and delusions.   Bizarre or disorganized behavior.   Confusion or disorientation.  DIAGNOSIS  A diagnosis is made by an evaluation of your symptoms. There are no medical or lab tests that lead to a diagnosis, but there are various questionnaires that a caregiver may use to identify those with the baby blues, postpartum depression, or psychosis. Often times, a screening tool called the Edinburgh Postnatal Depression Scale is used to diagnose depression in the postpartum period.  TREATMENT The baby blues usually goes away on its own in 1 to 2 weeks. Social support is often all that is needed. You should be encouraged to get adequate sleep and rest. Occasionally, you may be given medicines to help you sleep.  Postpartum depression requires treatment as it can last several months or longer if it is not treated. Treatment may include individual or group therapy, medicine, or both to address any social, physiological, and psychological factors that may play a role in the depression. Regular exercise, a healthy diet, rest, and social support may also be strongly recommended.  Postpartum psychosis is more serious and needs treatment right away. Hospitalization is often needed. HOME CARE INSTRUCTIONS  Get as much rest as you can. Nap when the baby sleeps.   Exercise regularly. Some women find yoga and walking to be beneficial.   Eat a balanced and nourishing diet.   Do little things that you enjoy. Have a cup of tea, take a bubble bath, read your favorite magazine, or listen to your favorite music.   Avoid alcohol.   Ask for help with household chores, cooking, grocery shopping, or running errands as needed. Do not try to do everything.   Talk to people close to you about how you are feeling. Get support from your partner, family members, friends, or other new  moms.   Try to stay positive in how you think. Think about the things you are grateful for.   Do not spend a lot of time alone.   Only take medicine as directed by your caregiver.   Keep all your postpartum appointments.   Let your caregiver know if you have any concerns.  SEEK MEDICAL CARE IF: You are having a reaction or problems with your medicine. SEEK IMMEDIATE MEDICAL CARE IF:  You have suicidal feelings.     You feel you may harm the baby or someone else.  Document Released: 11/17/2003 Document Revised: 02/01/2011 Document Reviewed: 12/19/2010 [employer] Patient Information 2012 [employer]. 

## 2011-05-11 NOTE — Discharge Summary (Signed)
Obstetric Discharge Summary Reason for Admission: induction of labor oligo; decreased fetal activity; prior IUFD at 37 weeks Prenatal Procedures: NST and ultrasound Intrapartum Procedures: spontaneous vaginal delivery; hydrotherapy in labor Postpartum Procedures: Rho(D) Ig and Tdap Complications-Operative and Postpartum: none, periurethral laceration w/ repair Hemoglobin  Date Value Range Status  05/08/2011 12.2  12.0-15.0 (g/dL) Final     HCT  Date Value Range Status  05/08/2011 37.8  36.0-46.0 (%) Final    Physical Exam:  General: alert and cooperative Lochia: appropriate Uterine Fundus: firm Incision: healing well DVT Evaluation: No evidence of DVT seen on physical exam.  Discharge Diagnoses: Term Pregnancy-delivered  Discharge Information: Date: 05/11/2011 Activity: pelvic rest Diet: routine Medications: Ibuprofen; PNV Condition: stable Instructions: refer to practice specific booklet Discharge to: home Follow-up Information    Follow up with Marlinda Mike, CNM. Schedule an appointment as soon as possible for a visit in 6 weeks.   Contact information:   [address] [place] [address] [phone]          Newborn Data: Live born female "Enid Derry" Birth Weight: 6 lb 6.8 oz (2915 g) APGAR: 8, 9  Home with mother.  Juanetta Beets, SNM [facility] 05/11/2011, 10:26 AM

## 2011-05-14 ENCOUNTER — Ambulatory Visit (HOSPITAL_COMMUNITY)
Admission: RE | Admit: 2011-05-14 | Discharge: 2011-05-14 | Disposition: A | Discharge status: Home / Self Care | Payer: BC Managed Care – PPO | Source: Ambulatory Visit | Attending: Obstetrics & Gynecology | Admitting: Obstetrics & Gynecology

## 2011-05-14 NOTE — Progress Notes (Signed)
Adult Lactation Consultation Outpatient Visit Note  Patient Name: Brandy Carroll  Infant Festus Aloe Date of Birth: May 26, 1980                     DOB [birth year]  Birth Weight - 6 lbs. 6.8 oz.  Weight today 6 lbs. 7 oz. Gestational Age at Delivery: 38 2/7 Type of Delivery: Vaginal Delivery   Breastfeeding History: Frequency of Breastfeeding: 3 hrs. Length of Feeding: 20 mins Voids: 6/24 hrs. Stools: 6/24 hrs. Yellow, seedy  Supplementing / Method: Pumping:  Type of Pump: Medela PIS   Frequency:  3 times   Volume:  35 ml    Comments: Mom states that she noticed her left nipple bleeding yesterday, so she stopped nursing on the left side.  She pumped 3 times, obtaining about 30 ml each time.  She wanted to rest the nipple.  Nipple is healed and she wanted help with the latch.  Her left breast is much larger and firmer than the right breast.    Consultation Evaluation:  Initial Feeding Assessment: Pre-feed Weight: 2920 gm Post-feed Weight: 2960 gm Amount Transferred: 40 ml Comments:  Left breast  Additional Feeding Assessment: Pre-feed Weight:  2960 gm Post-feed Weight:  2986 gm Amount Transferred:  26 ml Comments: left breast again  Total Breast milk Transferred this Visit: 66 ml Total Supplement Given: 0  Brandy Carroll was very nervous about having baby latching onto her left breast.  Reassured her that with my help, he would be able to nurse comfortably.  Lots of teaching on how to properly position baby, and her hands, so to facilitate a deeper latch.  Showed her how to bring baby onto breast quickly when he opens the widest.  Showed her how to compress the breast to increase active nutritive sucking.  Mom felt much relief that baby was able to latch and feed well, without causing her nipple to bleed.  Encouraged her to feed from both breasts now, on cue, at least every 3 hrs.    Follow-Up prn      Brandy Carroll 05/14/2011, 1:33 PM

## 2011-12-15 IMAGING — US US OB COMP LESS 14 WK
1 of 2 series · 13 of 28 positions shown · non-contrast
Comparison: None.

CLINICAL DATA: First trimester pregnancy with vaginal spotting.

OBSTETRIC <14 WK US AND TRANSVAGINAL OB US
TECHNIQUE: Both transabdominal and transvaginal ultrasound
examinations were performed for complete evaluation of the
gestation as well as the maternal uterus, adnexal regions, and
pelvic cul-de-sac.  Transvaginal technique was performed to assess
early pregnancy.

[Series 1: us ob comp less 14 wks · 13 of 38 slices shown]
[im 1/38]
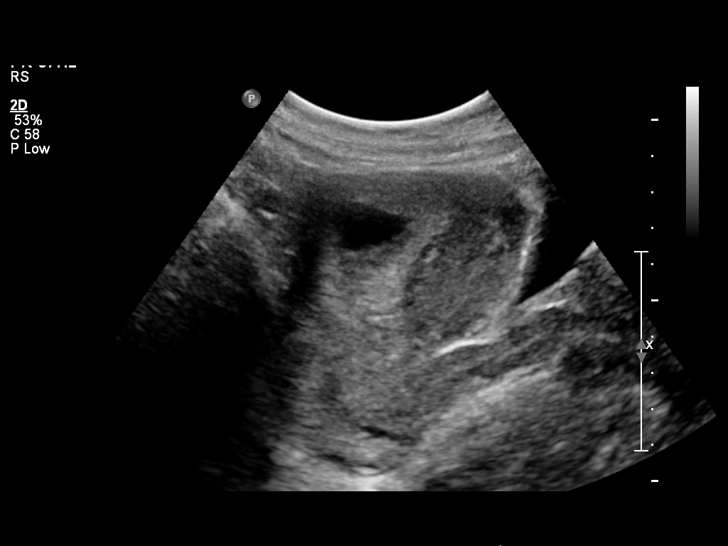
[im 3/38]
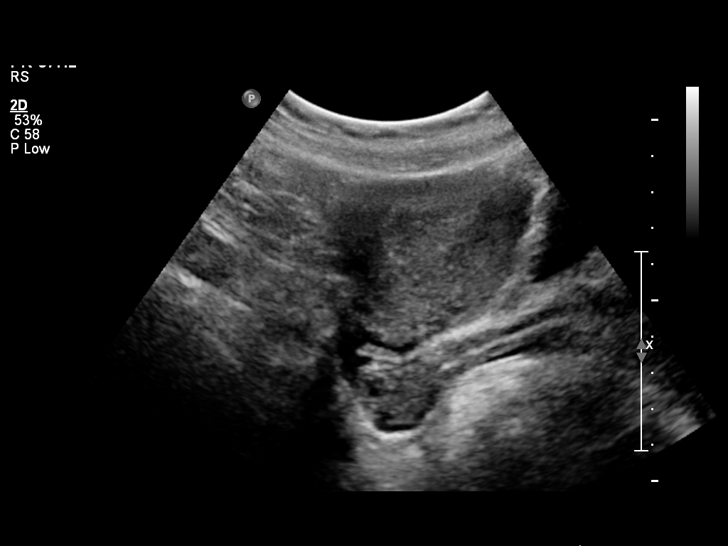
[im 6/38]
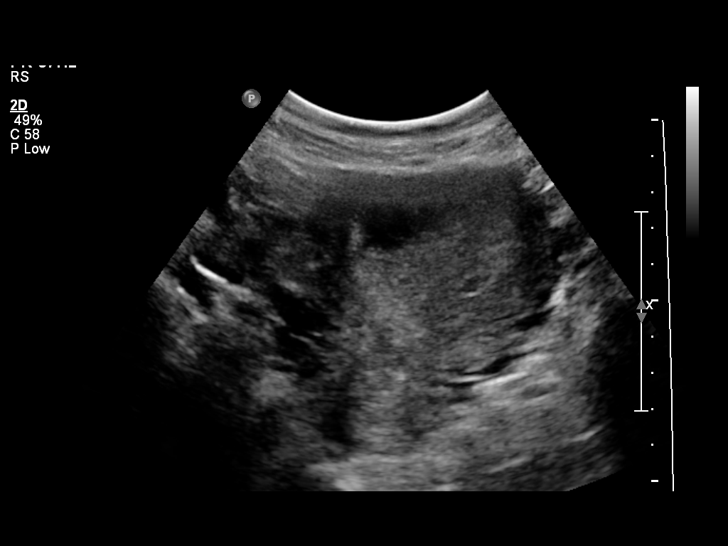
[im 9/38]
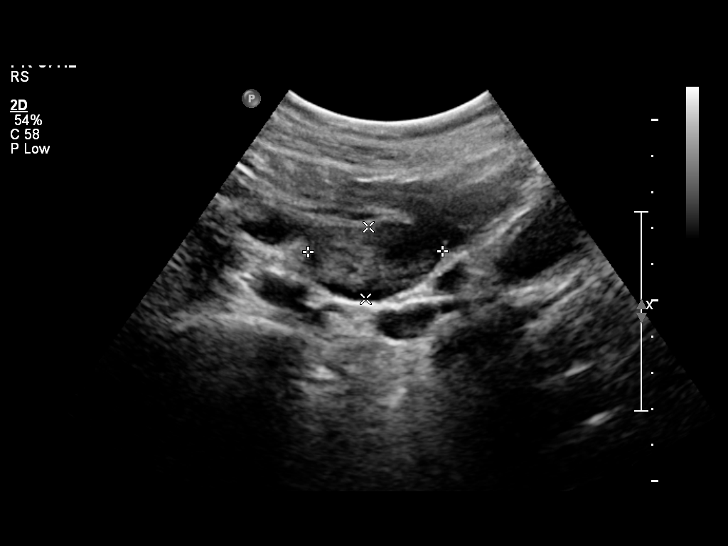
[im 12/38]
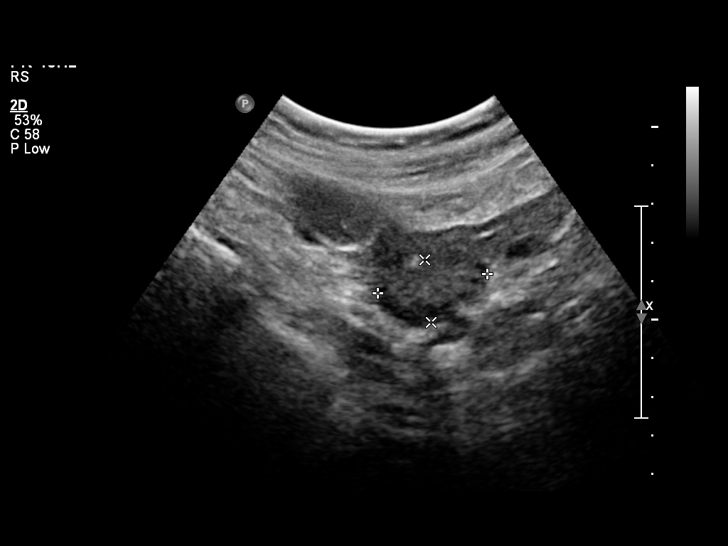
[im 15/38]
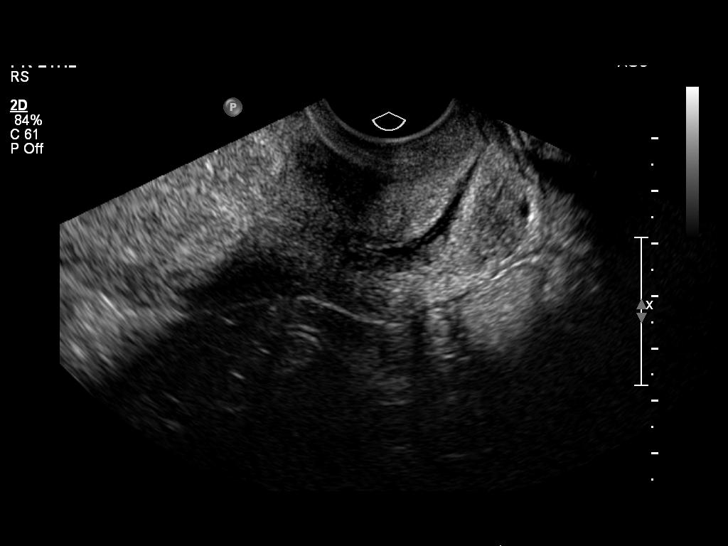
[im 19/38]
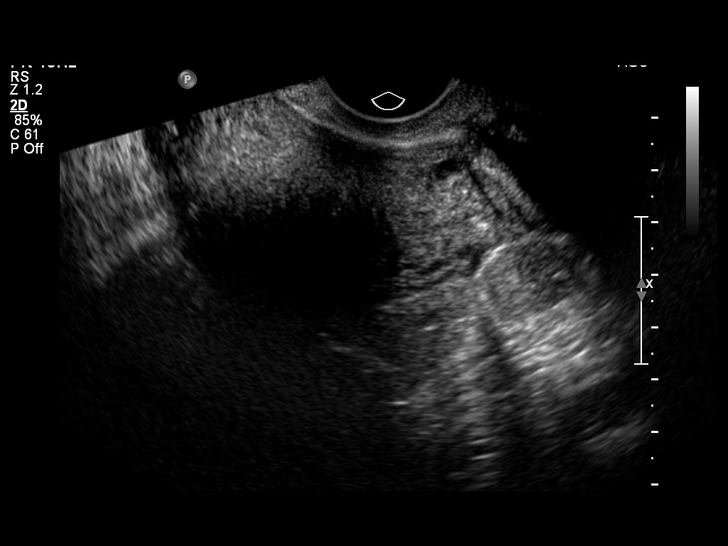
[im 22/38]
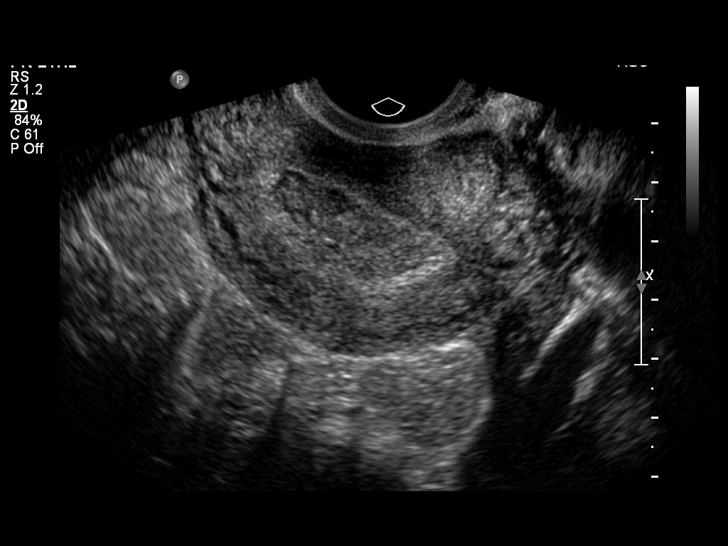
[im 25/38]
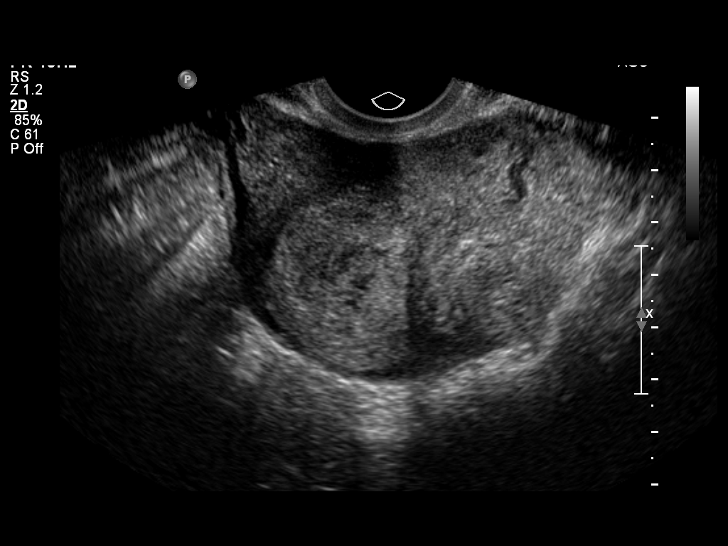
[im 28/38]
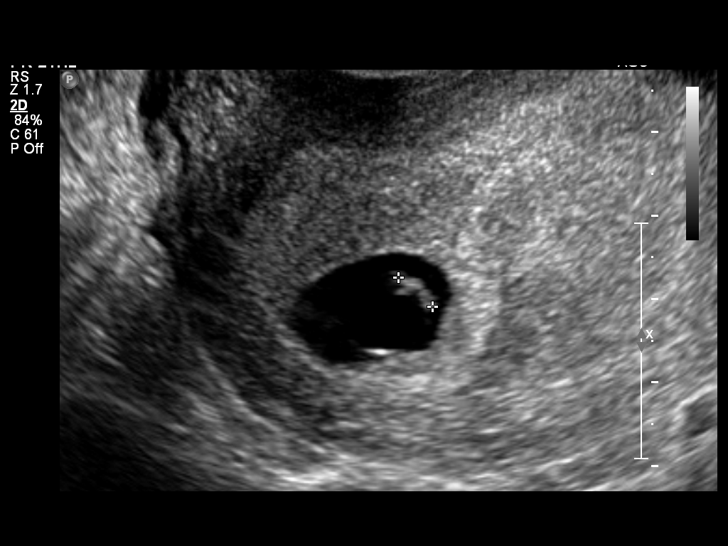
[im 30/38]
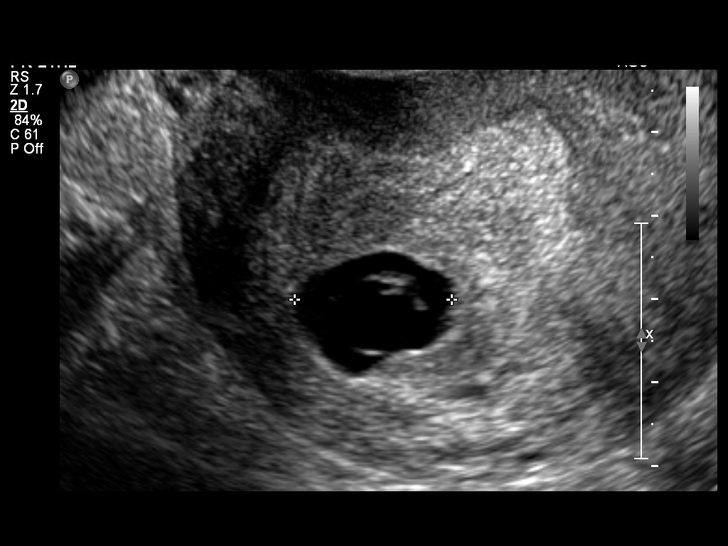
[im 33/38]
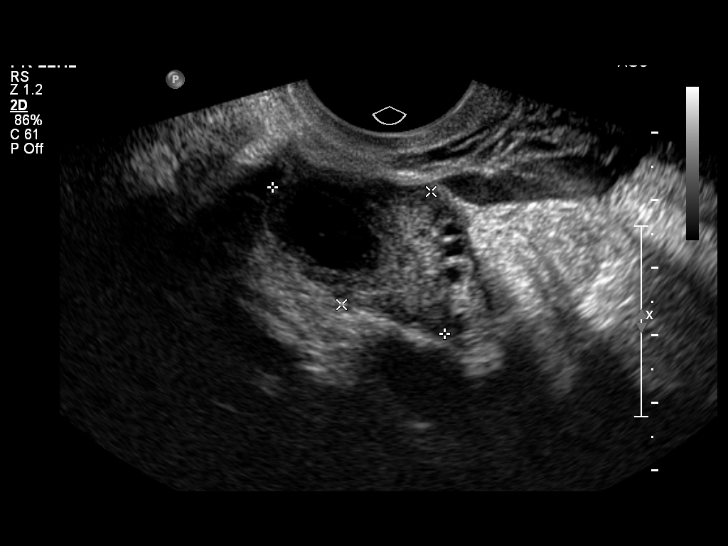
[im 36/38]
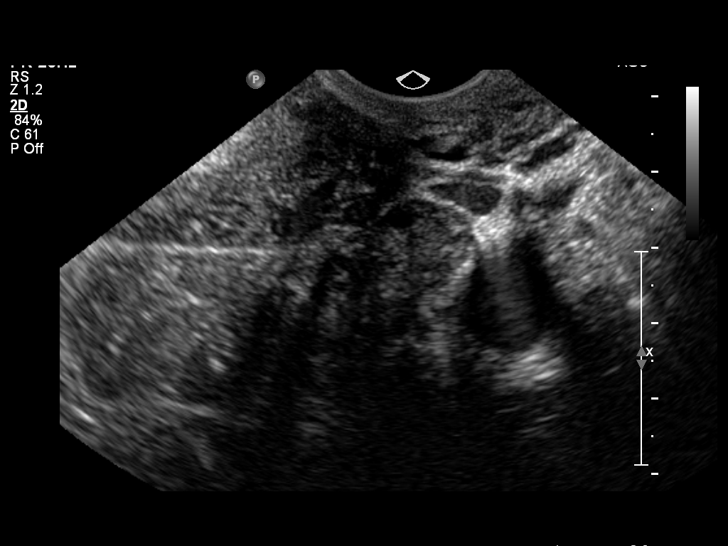

[13 of 28 positions shown; findings below may reference images not displayed]

Intrauterine gestational sac:  Single intrauterine gestational sac
appears slightly irregular in shape.
Yolk sac: Not well visualized.
Embryo: Single.
Cardiac Activity: Not visualized.

MSD: 18.3  mm  6 weeks 6 days
CRL: 5.1   mm  6 weeks 2 days

Maternal uterus/adnexae:
Both maternal ovaries are visualized.  Corpus luteum cyst is
present on the right.  There is no adnexal mass or free pelvic
fluid.
IMPRESSION: 1.  Single intrauterine gestation with best estimated gestational
age of 6 weeks 2 days based on crown rump length.
2.  Fetal viability is not definitely confirmed as cardiac motion
is not demonstrated.  This may be secondary to technical factors,
as an embryo size is borderline for demonstration of cardiac
motion.  Follow-up of the beta HCG levels and follow-up ultrasound
in 7 - 10 days are suggested to assess viability.

## 2013-12-28 ENCOUNTER — Encounter (HOSPITAL_COMMUNITY): Payer: Self-pay | Admitting: *Deleted

## 2014-11-10 LAB — OB RESULTS CONSOLE HEPATITIS B SURFACE ANTIGEN: Hepatitis B Surface Ag: NEGATIVE

## 2014-11-10 LAB — OB RESULTS CONSOLE HIV ANTIBODY (ROUTINE TESTING): HIV: NONREACTIVE

## 2014-11-10 LAB — OB RESULTS CONSOLE RUBELLA ANTIBODY, IGM: RUBELLA: IMMUNE

## 2014-11-10 LAB — OB RESULTS CONSOLE ANTIBODY SCREEN: ANTIBODY SCREEN: NEGATIVE

## 2014-11-10 LAB — OB RESULTS CONSOLE ABO/RH: RH Type: NEGATIVE

## 2014-11-10 LAB — OB RESULTS CONSOLE RPR: RPR: NONREACTIVE

## 2014-12-15 LAB — OB RESULTS CONSOLE GC/CHLAMYDIA
Chlamydia: NEGATIVE
GC PROBE AMP, GENITAL: NEGATIVE

## 2015-02-27 NOTE — L&D Delivery Note (Signed)
Delivery Note At 4:54 AM a healthy female was delivered via  (Presentation: Occiput post ).  APGAR: 9, 9; weight pending.   Placenta status: Intact, Spontaneous.  Cord:  with the following complications: none.  Cord pH: N/A  Anesthesia: Local  Episiotomy:  None Lacerations:  Small left anterior vulvar tear Suture Repair: Vicryl 4-0 Est. Blood Loss (mL):  200  Mom to postpartum.  Baby to Couplet care / Skin to Skin.  Brandy Carroll,MARIE-LYNE 06/09/2015, 5:13 AM

## 2015-05-10 LAB — OB RESULTS CONSOLE GBS: STREP GROUP B AG: NEGATIVE

## 2015-06-08 ENCOUNTER — Inpatient Hospital Stay (HOSPITAL_COMMUNITY)
Admission: AD | Admit: 2015-06-08 | Discharge: 2015-06-10 | DRG: 775 | Disposition: A | Discharge status: Home / Self Care | Payer: BC Managed Care – PPO | Source: Ambulatory Visit | Attending: Obstetrics & Gynecology | Admitting: Obstetrics & Gynecology

## 2015-06-08 ENCOUNTER — Encounter (HOSPITAL_COMMUNITY): Payer: Self-pay

## 2015-06-08 DIAGNOSIS — Z87440 Personal history of urinary (tract) infections: Secondary | ICD-10-CM

## 2015-06-08 DIAGNOSIS — Z8249 Family history of ischemic heart disease and other diseases of the circulatory system: Secondary | ICD-10-CM | POA: Diagnosis not present

## 2015-06-08 DIAGNOSIS — E282 Polycystic ovarian syndrome: Secondary | ICD-10-CM | POA: Diagnosis present

## 2015-06-08 DIAGNOSIS — Z8261 Family history of arthritis: Secondary | ICD-10-CM

## 2015-06-08 DIAGNOSIS — Z3A39 39 weeks gestation of pregnancy: Secondary | ICD-10-CM | POA: Diagnosis not present

## 2015-06-08 DIAGNOSIS — Z833 Family history of diabetes mellitus: Secondary | ICD-10-CM

## 2015-06-08 DIAGNOSIS — Z1589 Genetic susceptibility to other disease: Secondary | ICD-10-CM

## 2015-06-08 DIAGNOSIS — O24410 Gestational diabetes mellitus in pregnancy, diet controlled: Secondary | ICD-10-CM | POA: Diagnosis present

## 2015-06-08 NOTE — H&P (Signed)
  OB ADMISSION/ HISTORY & PHYSICAL:  Admission Date: 06/08/2015 11:27 PM  Admit Diagnosis: 39.1 weeks / labor onset  Brandy Carroll is a 35 y.o. female presenting for ctx since 6pm.  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 Gestational diabetes   . Gestational diabetes mellitus, class A2 05/08/2011  . Poor ob hx - Hx of fetal demise at 37wk 05/08/2011  . Postpartum care following vaginal delivery 05/11/2011     Past surgical history:  Past Surgical History  Procedure Laterality Date  . No past surgeries      Family History:  Family History  Problem Relation Age of Onset  . Arthritis Mother   . Hypertension Father   . Hyperlipidemia Father   . Arthritis Maternal Aunt   . Mental illness Maternal Aunt   . Early death Maternal Aunt   . Mental illness Maternal Uncle   . Cancer Paternal Aunt   . Diabetes Paternal Aunt   . Arthritis Maternal Grandmother   . Alcohol abuse Maternal Grandfather   . Heart disease Maternal Grandfather      Social History:  reports that she has never smoked. She does not have any smokeless tobacco history on file. She reports that she does not drink alcohol or use illicit drugs.  Allergies: Benzoyl peroxide   Ultrasound gel   Current Medications at time of admission:  Prior to Admission medications   Medication Sig Start Date End Date Taking? Authorizing Provider  Prenatal Vit-Fe Fumarate-FA (PRENATAL MULTIVITAMIN) TABS Take 1 tablet by mouth daily.    Historical Provider, MD   Review of Systems: Active FM onset of ctx @ 6pm bloody show absent  Physical Exam: VS: General: alert and oriented, appears calm and comfortable Heart: RRR Lungs: Clear lung fields Abdomen: Gravid, soft and non-tender, non-distended / uterus: gravid and non-tender Extremities: trace edema  Genitalia / VE:  8cm/ 90% / vtx 0 station / BBOW  FHR: baseline rate 150 / variability moderate / accelerations present / no decelerations TOCO: Q 3-5 minutes  Assessment: 39.[redacted] weeks gestation active stage of labor FHR category 1  Plan:  Admit Expectant management NST then intermittent EFM AROM with water immersion for repeat water birth  Dr Seymour BarsLavoie notified of admission / plan of care   Marlinda MikeBAILEY, Obdulia Steier CNM, MSN, [facility] 06/08/2015, 11:46 PM

## 2015-06-09 ENCOUNTER — Encounter (HOSPITAL_COMMUNITY): Payer: Self-pay

## 2015-06-09 LAB — RPR: RPR Ser Ql: NONREACTIVE

## 2015-06-09 LAB — TYPE AND SCREEN
ABO/RH(D): A NEG
Antibody Screen: NEGATIVE

## 2015-06-09 LAB — CBC
HCT: 37.9 % (ref 36.0–46.0)
Hemoglobin: 12.8 g/dL (ref 12.0–15.0)
MCH: 30.8 pg (ref 26.0–34.0)
MCHC: 33.8 g/dL (ref 30.0–36.0)
MCV: 91.3 fL (ref 78.0–100.0)
Platelets: 253 10*3/uL (ref 150–400)
RBC: 4.15 MIL/uL (ref 3.87–5.11)
RDW: 15.1 % (ref 11.5–15.5)
WBC: 10 10*3/uL (ref 4.0–10.5)

## 2015-06-09 MED ORDER — DIBUCAINE 1 % RE OINT: 1.0000 "application " | TOPICAL_OINTMENT | RECTAL | Status: DC | PRN

## 2015-06-09 MED ORDER — OXYCODONE-ACETAMINOPHEN 5-325 MG PO TABS: 2.0000 | ORAL_TABLET | ORAL | Status: DC | PRN

## 2015-06-09 MED ORDER — DIPHENHYDRAMINE HCL 25 MG PO CAPS: 25.0000 mg | ORAL_CAPSULE | Freq: Four times a day (QID) | ORAL | Status: DC | PRN

## 2015-06-09 MED ORDER — ZOLPIDEM TARTRATE 5 MG PO TABS: 5.0000 mg | ORAL_TABLET | Freq: Every evening | ORAL | Status: DC | PRN

## 2015-06-09 MED ORDER — LIDOCAINE HCL (PF) 1 % IJ SOLN
30.0000 mL | INTRAMUSCULAR | Status: DC | PRN
Start: 2015-06-09 — End: 2015-06-09
  Administered 2015-06-09: 30 mL via SUBCUTANEOUS
  Filled 2015-06-09: qty 30

## 2015-06-09 MED ORDER — PRENATAL MULTIVITAMIN CH
1.0000 | ORAL_TABLET | Freq: Every day | ORAL | Status: DC
  Filled 2015-06-09 (×2): qty 1

## 2015-06-09 MED ORDER — BENZOCAINE-MENTHOL 20-0.5 % EX AERO
1.0000 | INHALATION_SPRAY | CUTANEOUS | Status: DC | PRN
Start: 2015-06-09 — End: 2015-06-10

## 2015-06-09 MED ORDER — ONDANSETRON HCL 4 MG/2ML IJ SOLN: 4.0000 mg | INTRAMUSCULAR | Status: DC | PRN

## 2015-06-09 MED ORDER — LACTATED RINGERS IV SOLN: 500.0000 mL | INTRAVENOUS | Status: DC | PRN

## 2015-06-09 MED ORDER — ACETAMINOPHEN 325 MG PO TABS: 650.0000 mg | ORAL_TABLET | ORAL | Status: DC | PRN

## 2015-06-09 MED ORDER — COCONUT OIL OIL: 1.0000 "application " | TOPICAL_OIL | Status: DC | PRN

## 2015-06-09 MED ORDER — OXYTOCIN 10 UNIT/ML IJ SOLN: 10.0000 [IU] | Freq: Once | INTRAMUSCULAR | Status: DC

## 2015-06-09 MED ORDER — SENNOSIDES-DOCUSATE SODIUM 8.6-50 MG PO TABS
2.0000 | ORAL_TABLET | ORAL | Status: DC
  Filled 2015-06-09: qty 2

## 2015-06-09 MED ORDER — TETANUS-DIPHTH-ACELL PERTUSSIS 5-2.5-18.5 LF-MCG/0.5 IM SUSP
0.5000 mL | Freq: Once | INTRAMUSCULAR | Status: AC
  Administered 2015-06-10: 0.5 mL via INTRAMUSCULAR
  Filled 2015-06-09: qty 0.5

## 2015-06-09 MED ORDER — ONDANSETRON HCL 4 MG PO TABS: 4.0000 mg | ORAL_TABLET | ORAL | Status: DC | PRN

## 2015-06-09 MED ORDER — CITRIC ACID-SODIUM CITRATE 334-500 MG/5ML PO SOLN: 30.0000 mL | ORAL | Status: DC | PRN

## 2015-06-09 MED ORDER — OXYCODONE-ACETAMINOPHEN 5-325 MG PO TABS: 1.0000 | ORAL_TABLET | ORAL | Status: DC | PRN

## 2015-06-09 MED ORDER — IBUPROFEN 600 MG PO TABS
600.0000 mg | ORAL_TABLET | Freq: Four times a day (QID) | ORAL | Status: DC
  Filled 2015-06-09 (×5): qty 1

## 2015-06-09 MED ORDER — SIMETHICONE 80 MG PO CHEW: 80.0000 mg | CHEWABLE_TABLET | ORAL | Status: DC | PRN

## 2015-06-09 MED ORDER — WITCH HAZEL-GLYCERIN EX PADS: 1.0000 "application " | MEDICATED_PAD | CUTANEOUS | Status: DC | PRN

## 2015-06-09 NOTE — Lactation Note (Signed)
This note was copied from a baby's chart. Lactation Consultation Note  Patient Name: Brandy Carroll ONGEX'BToday's Date: 06/09/2015 Reason for consult: Initial assessment  Baby 12 hours old. Mom reports that she nursed her son for 1517 months and he was "tongue-tied." Mom reports that she brought baby in for OP/Lactation appointment for latching assistance, and baby eventually able to nurse well. Mom reports that she does not believe that this baby's tongue looks like her son's did. Assisted mom to latch baby in football position to her left breast. Mom reports that left nipple is large and she is having trouble getting baby to latch. Baby latched deeply, suckling rhythmically with a few swallows noted. Demonstrated to mom how to flange baby's lower lip and mom reported increased comfort. Enc mom several times to keep baby tucked in close to her breast and not to pull breast back as this causes the baby to lose the deep latch. But mom continued to pull her breast back and away from baby in order to see the latch. Mom able to hand express colostrum. Demonstrated to mom how to stimulate baby to keep nursing. Enc mom to offer baby lots of tummy time to help relax baby's neck and mouth muscles, and to offer lots of STS and nuzzling at breast. Mom reports that she is cramping while nursing. Enc mom to call for assistance with latching as needed. [facility] given [facility], aware of OP/BFSG and LC phone line assistance after D/C.    Maternal Data Has patient been taught Hand Expression?: Yes Does the patient have breastfeeding experience prior to this delivery?: Yes  Feeding Feeding Type: Breast Fed Length of feed:  (LC assessed first 10 minutes of BF.)  LATCH Score/Interventions Latch: Grasps breast easily, tongue down, lips flanged, rhythmical sucking.  Audible Swallowing: A few with stimulation Intervention(s): Skin to skin;Hand expression  Type of Nipple: Everted at rest and after  stimulation  Comfort (Breast/Nipple): Soft / non-tender     Hold (Positioning): Assistance needed to correctly position infant at breast and maintain latch.  LATCH Score: 8  Lactation Tools Discussed/Used     Consult Status Consult Status: Follow-up Date: 06/10/15 Follow-up type: In-patient    Geralynn OchsWILLIARD, Shanae Luo 06/09/2015, 5:24 PM

## 2015-06-10 LAB — CBC
HCT: 38.6 % (ref 36.0–46.0)
Hemoglobin: 13 g/dL (ref 12.0–15.0)
MCH: 30.7 pg (ref 26.0–34.0)
MCHC: 33.7 g/dL (ref 30.0–36.0)
MCV: 91 fL (ref 78.0–100.0)
PLATELETS: 267 10*3/uL (ref 150–400)
RBC: 4.24 MIL/uL (ref 3.87–5.11)
RDW: 15.3 % (ref 11.5–15.5)
WBC: 12.1 10*3/uL — AB (ref 4.0–10.5)

## 2015-06-10 MED ORDER — IBUPROFEN 600 MG PO TABS: 600.0000 mg | ORAL_TABLET | Freq: Four times a day (QID) | ORAL | Status: AC

## 2015-06-10 MED ORDER — RHO D IMMUNE GLOBULIN 1500 UNIT/2ML IJ SOSY
300.0000 ug | PREFILLED_SYRINGE | Freq: Once | INTRAMUSCULAR | Status: AC
  Administered 2015-06-10: 300 ug via INTRAMUSCULAR
  Filled 2015-06-10: qty 2

## 2015-06-10 NOTE — Discharge Summary (Signed)
Obstetric Discharge Summary  Reason for Admission: onset of labor Prenatal Procedures: NST Intrapartum Procedures: spontaneous vaginal delivery Postpartum Procedures: Rhogam given Complications-Operative and Postpartum: none HEMOGLOBIN  Date Value Ref Range Status  06/10/2015 13.0 12.0 - 15.0 g/dL Final   HCT  Date Value Ref Range Status  06/10/2015 38.6 36.0 - 46.0 % Final    Physical Exam:  General: alert, cooperative and no distress Lochia: appropriate Uterine Fundus: firm Perineum: intact DVT Evaluation: No evidence of DVT seen on physical exam.  Discharge Diagnoses: Term Pregnancy-delivered  Discharge Information: Date: 06/10/2015 Activity: pelvic rest Diet: routine Medications: PNV and Ibuprofen Condition: stable Instructions: refer to practice specific booklet Discharge to: home Follow-up Information    Follow up with Marlinda MikeBAILEY, Brandy Carroll, CNM. Schedule an appointment as soon as possible for a visit in 6 weeks.   Specialty:  Obstetrics and Gynecology   Contact information:   [address] [place] [id number] [phone]       Newborn Data: Live born female  Birth Weight: 7 lb 0.3 oz (3184 g) APGAR: 9, 9  Home with mother.  Marlinda MikeBAILEY, Jerrie Gullo 06/10/2015, 10:26 AM

## 2015-06-10 NOTE — Progress Notes (Signed)
PPD 1 SVD  S:  Reports feeling well - ready to go home             Tolerating po/ No nausea or vomiting             Bleeding is light             Pain controlled with motrin             Up ad lib / ambulatory / voiding QS  Newborn breast feeding  O:               VS: BP 121/79 mmHg  Pulse 84  Temp(Src) 98.3 F (36.8 C) (Oral)  Resp 18  Ht 5\' 6"  (1.676 m)  Wt 92.08 kg (203 lb)  BMI 32.78 kg/m2  SpO2 95%  Breastfeeding? Unknown   LABS:              Recent Labs  06/09/15 0023 06/10/15 0505  WBC 10.0 12.1*  HGB 12.8 13.0  PLT 253 267               Blood type: --/--/A NEG (04/14 0508) / newborn RH pos - rhogam today  Rubella: Immune (09/14 0000)                        Physical Exam:             Alert and oriented X3  Abdomen: soft, non-tender, non-distended              Fundus: firm, non-tender, U-1  Perineum: no edema / intact  Lochia: light  Extremities: no edema, no calf pain or tenderness    A: PPD # 1   Doing well - stable status  P: Routine post partum orders  DC home  Marlinda MikeBAILEY, TANYA CNM, MSN, [facility] 06/10/2015, 10:22 AM

## 2015-06-10 NOTE — Progress Notes (Signed)
S:  Ctx same - breathing with ctx        Resting lateral peanut        Labor in tub x 1 hour - desires to re-enter tub  O:  VS: Blood pressure 121/79, pulse 84, temperature 98.3 F (36.8 C), temperature source Oral, resp. rate 18, height 5\' 6"  (1.676 m), weight 92.08 kg (203 lb), SpO2 95 %, unknown if currently breastfeeding.        FHR : baseline 150 / variability moderate / accelerations absent / variable decelerations        Toco: contractions every 4-6 minutes / moderate        Cervix : 8cm / 90% vtx +1 / ROP        Membranes: thin MEC - yellow  A: active labor with persistent OP presentation     FHR category 2  P: monitor FHR - reposition opposite side with peanut to faciliate fetal rotation        unable to re-enter water with variable decels - if resolve & FHR cat 1 can re-enter         may need augmentation if no progression in next 1-2 hr   BAILEY, Kenney HousemanANYA CNM, MSN, [facility]NM

## 2015-06-10 NOTE — Lactation Note (Signed)
This note was copied from a baby's chart. Lactation Consultation Note:  Mother states that breastfeeding is going well.  She states that she feels that infant is feeding better, she is feeling slight pinching. Discussed tips for better depth.  Mother denies questions Offered resources and information on prevention of engorgement treatment.  Mother mentioned that older child had a tongue tie but was not revised.  She states that infant breastfeed well for 17 months. advised mother to continue to observed milk supply infants out put. Mother was offered pre and post weight visit. She will call as needed.   Patient Name: Brandy Carroll'XToday's Date: 06/10/2015 Reason for consult: Follow-up assessment   Maternal Data    Feeding Length of feed: 5 min  LATCH Score/Interventions Latch:  (to call RN for [facility], feels not always deep enough latch)                    Lactation Tools Discussed/Used     Consult Status Consult Status: Complete    [name], [name] 06/10/2015, 11:03 AM

## 2015-06-11 LAB — RH IG WORKUP (INCLUDES ABO/RH)
ABO/RH(D): A NEG
FETAL SCREEN: NEGATIVE
Gestational Age(Wks): 39.2
Unit division: 0

## 2017-07-26 ENCOUNTER — Other Ambulatory Visit (HOSPITAL_COMMUNITY): Payer: Self-pay

## 2017-07-26 DIAGNOSIS — Z3482 Encounter for supervision of other normal pregnancy, second trimester: Secondary | ICD-10-CM

## 2017-08-06 ENCOUNTER — Encounter (HOSPITAL_COMMUNITY): Payer: Self-pay

## 2017-08-12 ENCOUNTER — Encounter (HOSPITAL_COMMUNITY): Payer: Self-pay | Admitting: *Deleted

## 2017-08-14 ENCOUNTER — Encounter (HOSPITAL_COMMUNITY): Payer: BC Managed Care – PPO

## 2017-08-14 ENCOUNTER — Ambulatory Visit (HOSPITAL_COMMUNITY)
Admission: RE | Admit: 2017-08-14 | Discharge: 2017-08-14 | Disposition: A | Discharge status: Home / Self Care | Payer: BC Managed Care – PPO | Source: Ambulatory Visit

## 2017-08-14 ENCOUNTER — Encounter (HOSPITAL_COMMUNITY): Payer: Self-pay

## 2017-08-14 DIAGNOSIS — Z3A19 19 weeks gestation of pregnancy: Secondary | ICD-10-CM | POA: Diagnosis not present

## 2017-08-14 DIAGNOSIS — Z363 Encounter for antenatal screening for malformations: Secondary | ICD-10-CM | POA: Diagnosis not present

## 2017-08-14 DIAGNOSIS — Z3482 Encounter for supervision of other normal pregnancy, second trimester: Secondary | ICD-10-CM

## 2017-08-14 DIAGNOSIS — O09529 Supervision of elderly multigravida, unspecified trimester: Secondary | ICD-10-CM | POA: Diagnosis present

## 2017-08-14 DIAGNOSIS — O09522 Supervision of elderly multigravida, second trimester: Secondary | ICD-10-CM | POA: Insufficient documentation

## 2017-08-14 HISTORY — DX: Urinary tract infection, site not specified: N39.0

## 2018-01-05 ENCOUNTER — Inpatient Hospital Stay (HOSPITAL_COMMUNITY)
Admission: AD | Admit: 2018-01-05 | Payer: BC Managed Care – PPO | Source: Ambulatory Visit | Admitting: Obstetrics and Gynecology

## 2018-03-30 ENCOUNTER — Encounter (HOSPITAL_COMMUNITY): Payer: Self-pay
